# Patient Record
Sex: Female | Born: 1945 | Race: White | Hispanic: No | Smoking: Never smoker
Health system: Southern US, Community
[De-identification: ages and names within clinical notes are randomized; demographics above are authoritative.]

## PROBLEM LIST (undated history)

## (undated) DIAGNOSIS — D649 Anemia, unspecified: Secondary | ICD-10-CM

## (undated) DIAGNOSIS — H269 Unspecified cataract: Secondary | ICD-10-CM

## (undated) DIAGNOSIS — M797 Fibromyalgia: Secondary | ICD-10-CM

## (undated) DIAGNOSIS — M069 Rheumatoid arthritis, unspecified: Secondary | ICD-10-CM

## (undated) DIAGNOSIS — C50919 Malignant neoplasm of unspecified site of unspecified female breast: Secondary | ICD-10-CM

## (undated) HISTORY — DX: Rheumatoid arthritis, unspecified: M06.9

## (undated) HISTORY — DX: Anemia, unspecified: D64.9

## (undated) HISTORY — DX: Fibromyalgia: M79.7

## (undated) HISTORY — DX: Unspecified cataract: H26.9

## (undated) HISTORY — DX: Malignant neoplasm of unspecified site of unspecified female breast: C50.919

---

## 2012-01-26 ENCOUNTER — Ambulatory Visit: Payer: Self-pay | Admitting: Internal Medicine

## 2020-09-13 DIAGNOSIS — C50919 Malignant neoplasm of unspecified site of unspecified female breast: Secondary | ICD-10-CM

## 2020-09-13 HISTORY — DX: Malignant neoplasm of unspecified site of unspecified female breast: C50.919

## 2020-09-13 HISTORY — PX: BREAST BIOPSY: SHX20

## 2021-01-10 ENCOUNTER — Encounter: Payer: Self-pay | Admitting: Oncology

## 2021-01-10 ENCOUNTER — Inpatient Hospital Stay: Payer: Medicare Other | Attending: Oncology | Admitting: Oncology

## 2021-01-10 ENCOUNTER — Telehealth: Payer: Self-pay | Admitting: Oncology

## 2021-01-10 ENCOUNTER — Inpatient Hospital Stay: Payer: Medicare Other

## 2021-01-10 ENCOUNTER — Encounter (INDEPENDENT_AMBULATORY_CARE_PROVIDER_SITE_OTHER): Payer: Self-pay

## 2021-01-10 VITALS — BP 165/91 | HR 82 | Temp 97.8°F | Wt 204.2 lb

## 2021-01-10 DIAGNOSIS — Z800 Family history of malignant neoplasm of digestive organs: Secondary | ICD-10-CM | POA: Insufficient documentation

## 2021-01-10 DIAGNOSIS — Z170 Estrogen receptor positive status [ER+]: Secondary | ICD-10-CM | POA: Insufficient documentation

## 2021-01-10 DIAGNOSIS — Z87898 Personal history of other specified conditions: Secondary | ICD-10-CM

## 2021-01-10 DIAGNOSIS — Z8379 Family history of other diseases of the digestive system: Secondary | ICD-10-CM

## 2021-01-10 DIAGNOSIS — C50911 Malignant neoplasm of unspecified site of right female breast: Secondary | ICD-10-CM

## 2021-01-10 DIAGNOSIS — N6320 Unspecified lump in the left breast, unspecified quadrant: Secondary | ICD-10-CM

## 2021-01-10 DIAGNOSIS — R928 Other abnormal and inconclusive findings on diagnostic imaging of breast: Secondary | ICD-10-CM

## 2021-01-10 DIAGNOSIS — Z79899 Other long term (current) drug therapy: Secondary | ICD-10-CM | POA: Diagnosis not present

## 2021-01-10 DIAGNOSIS — R109 Unspecified abdominal pain: Secondary | ICD-10-CM | POA: Insufficient documentation

## 2021-01-10 DIAGNOSIS — Z801 Family history of malignant neoplasm of trachea, bronchus and lung: Secondary | ICD-10-CM | POA: Diagnosis not present

## 2021-01-10 DIAGNOSIS — Z833 Family history of diabetes mellitus: Secondary | ICD-10-CM | POA: Insufficient documentation

## 2021-01-10 NOTE — Telephone Encounter (Signed)
Call placed to schedule appointments no answer after 3 attempts, note added for verification of calls made.

## 2021-01-11 NOTE — Progress Notes (Signed)
Supported patient and her daughter at initial Med/Onc visit with Dr. Janese Banks.   Her breast cancer was diagnosed at [facility] in September 2021, but patient lives in [place], and wants to come heare for breast cancer care.   Dr. Janese Banks thoroughly explained treatment options.  Treatment plan will be determined after mammogram, and ultrasound per patient request.

## 2021-01-13 ENCOUNTER — Inpatient Hospital Stay
Admission: RE | Admit: 2021-01-13 | Discharge: 2021-01-13 | Disposition: A | Discharge status: Home / Self Care | Payer: Self-pay | Source: Ambulatory Visit | Attending: *Deleted | Admitting: *Deleted

## 2021-01-13 ENCOUNTER — Other Ambulatory Visit: Payer: Self-pay | Admitting: *Deleted

## 2021-01-13 DIAGNOSIS — Z1231 Encounter for screening mammogram for malignant neoplasm of breast: Secondary | ICD-10-CM

## 2021-01-17 ENCOUNTER — Encounter: Payer: Self-pay | Admitting: Oncology

## 2021-01-17 NOTE — Progress Notes (Signed)
Hematology/Oncology Consult note [facility] [phone] [phone]  Patient Care Team: Gladstone Lighter, MD as PCP - General (Internal Medicine)   Name of the patient: Sylvia Rivera  [id number]  1946-09-28    Reason for referral-new diagnosis of breast cancer   Referring physician-Dr. Tressia Miners  Date of visit: 01/17/21   History of presenting illness- Patient is a 75 year old female who was found to have a right breast mass on the CT scan done for abdominal pain.  This was followed by a bilateral mammogram on 09/06/2020 which showed a 1.9 cm spiculated mass in the right breast and 2.4 cm mass in the left breast the right breast mass was a BI-RADS Category 5 and the left breast mass was a BI-RADS Category 4 both these breast masses were biopsied.  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  Tumor was ER 100% positive PR 5% positive and HER-2 equivocal by IHC but negative by FISH.  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  Patient was seen by surgical oncology at [facility] but then decided to move her care to [facility] and has not sought any treatment for her cancer.  She is presently unsure if she would like to pursue any treatment for her breast cancer.  Patient is independent of her ADLs and denies any significant complaints at this time  ECOG PS- 1  Pain scale- 0   Review of systems- Review of Systems  Constitutional: Negative for chills, fever, malaise/fatigue and weight loss.  HENT: Negative for congestion, ear discharge and nosebleeds.   Eyes: Negative for blurred vision.  Respiratory: Negative for cough, hemoptysis, sputum production, shortness of breath and wheezing.   Cardiovascular: Negative for chest pain, palpitations, orthopnea and claudication.  Gastrointestinal: Negative for abdominal pain,  blood in stool, constipation, diarrhea, heartburn, melena, nausea and vomiting.  Genitourinary: Negative for dysuria, flank pain, frequency, hematuria and urgency.  Musculoskeletal: Negative for back pain, joint pain and myalgias.  Skin: Negative for rash.  Neurological: Negative for dizziness, tingling, focal weakness, seizures, weakness and headaches.  Endo/Heme/Allergies: Does not bruise/bleed easily.  Psychiatric/Behavioral: Negative for depression and suicidal ideas. The patient does not have insomnia.     Not on File  There are no problems to display for this patient.    Past Medical History:  Diagnosis Date  . Anemia   . Cataract   . Fibromyalgia   . Rheumatoid arthritis ([place])      History reviewed. No pertinent surgical history.  Social History   Socioeconomic History  . Marital status: Widowed    Spouse name: Not on file  . Number of children: Not on file  . Years of education: Not on file  . Highest education level: Not on file  Occupational History  . Not on file  Tobacco Use  . Smoking status: Never Smoker  . Smokeless tobacco: Never Used  Vaping Use  . Vaping Use: Never used  Substance and Sexual Activity  . Alcohol use: Never  . Drug use: Never  . Sexual activity: Not Currently  Other Topics Concern  . Not on file  Social History Narrative  . Not on file   Social Determinants of Health   Financial Resource Strain: Not on file  Food Insecurity: Not on file  Transportation Needs: Not on file  Physical Activity: Not on file  Stress: Not on file  Social Connections:  Not on file  Intimate Partner Violence: Not on file     Family History  Problem Relation Age of Onset  . Esophageal cancer Sister   . Lung cancer Sister   . Cirrhosis Brother   . Diabetes Maternal Grandmother   . Lung cancer Niece      Current Outpatient Medications:  .  acetaminophen (TYLENOL) 500 MG tablet, Take 500 mg by mouth daily., Disp: , Rfl:  .  aspirin 81 MG  chewable tablet, Chew by mouth., Disp: , Rfl:  .  ferrous sulfate 325 (65 FE) MG tablet, daily., Disp: , Rfl:  .  vitamin B-12 (CYANOCOBALAMIN) 1000 MCG tablet, Take 1,000 mcg by mouth daily., Disp: , Rfl:  .  VITAMIN D PO, Take 1,000 mcg by mouth daily., Disp: , Rfl:    Physical exam:  Vitals:   01/10/21 1342 01/10/21 1400  BP: (!) 174/85 (!) 165/91  Pulse: 81 82  Temp: 97.8 F (36.6 C)   TempSrc: Tympanic   Weight: 204 lb 3.2 oz (92.6 kg)    Physical Exam Constitutional:      General: She is not in acute distress. Eyes:     Extraocular Movements: EOM normal.  Cardiovascular:     Rate and Rhythm: Normal rate and regular rhythm.     Heart sounds: Normal heart sounds.  Pulmonary:     Effort: Pulmonary effort is normal.     Breath sounds: Normal breath sounds.  Abdominal:     General: Bowel sounds are normal.     Palpations: Abdomen is soft.  Skin:    General: Skin is warm and dry.  Neurological:     Mental Status: She is alert and oriented to person, place, and time.    Breast exam: 1.5 cm retroareolar mass palpable in the right breast and 2.5 cm cystic mass palpable in the left breast in the retroareolar region.  No evidence of skin changes nipple inversion.  No palpable bilateral axillary adenopathy    No flowsheet data found. No flowsheet data found.  No images are attached to the encounter.  MM Outside Films Mammo  Result Date: 01/14/2021 This examination belongs to an outside facility and is stored here for comparison purposes only.  Contact the originating outside institution for any associated report or interpretation.  MM Outside Films Mammo  Result Date: 01/14/2021 This examination belongs to an outside facility and is stored here for comparison purposes only.  Contact the originating outside institution for any associated report or interpretation.  MM Outside Films Mammo  Result Date: 01/14/2021 This examination belongs to an outside facility and is stored  here for comparison purposes only.  Contact the originating outside institution for any associated report or interpretation.   Assessment and plan- Patient is a 75 y.o. female with newly diagnosed right breast invasive mammary carcinoma here to discuss further management  Patient had a bilateral diagnostic mammogram in September 2021 at [facility] which showed bilateral breast masses.  Right breast mass was 1.5 cm and reported as BI-RADS 5.  This was biopsy-proven invasive mammary carcinoma that was ER/PR positive and HER-2 negative by FISH grade 3.  The left breast mass showed focal usual ductal hyperplasia but no evidence of atypia or malignancy.  Patient has not pursued any treatment for this since then.  At this time would like to get a repeat bilateral diagnostic mammogram to determine how the mass has grown over the last 4 months.  I would also like to touch base with  radiology if they feel that the left breast biopsy results are concordant with radiological appearance.  Patient does not wish to be seen by surgery at this time.  She would like to discuss the results of her mammogram with me and then decide how to proceed  I explained to the patient that she has ER/PR positive breast cancer which would warrant hormone therapy for at least 5 years.  Standard of care treatment for invasive carcinoma would be surgery which can be either lumpectomy with radiation versus mastectomy.  She did have high-grade histology in her right breast biopsy but based on my conversation today I do not think the patient would be interested in Oncotype testing post surgery to determine if she would benefit from chemotherapy.  I will discuss everything with her in greater detail after her mammogram results are back   Thank you for this kind referral and the opportunity to participate in the care of this patient   Visit Diagnosis 1. Malignant neoplasm of right female breast, unspecified estrogen receptor status, unspecified  site of breast ([place])   2. Abnormal mammogram of left breast     Dr. Randa Evens, MD, MPH [facility] at [facility] [phone] 01/17/2021

## 2021-01-20 ENCOUNTER — Other Ambulatory Visit

## 2021-01-20 ENCOUNTER — Encounter

## 2021-01-24 ENCOUNTER — Ambulatory Visit
Admission: RE | Admit: 2021-01-24 | Discharge: 2021-01-24 | Disposition: A | Discharge status: Home / Self Care | Payer: Medicare Other | Source: Ambulatory Visit | Attending: Oncology | Admitting: Oncology

## 2021-01-24 ENCOUNTER — Telehealth: Payer: Self-pay | Admitting: Oncology

## 2021-01-24 ENCOUNTER — Other Ambulatory Visit: Payer: Self-pay

## 2021-01-24 DIAGNOSIS — C50911 Malignant neoplasm of unspecified site of right female breast: Secondary | ICD-10-CM | POA: Insufficient documentation

## 2021-01-24 DIAGNOSIS — R928 Other abnormal and inconclusive findings on diagnostic imaging of breast: Secondary | ICD-10-CM

## 2021-01-24 NOTE — Telephone Encounter (Signed)
Called daughter (per pt request) to schedule f/u with Dr. Janese Banks next week. Daughter is only able to transport pt on wed/thur. Appt confirmed on 01/30/21.

## 2021-01-28 ENCOUNTER — Telehealth: Payer: Self-pay | Admitting: Oncology

## 2021-01-28 NOTE — Telephone Encounter (Signed)
Pt called to cancel her 2/17 follow-up appt. Will have her daughter contact the office to reschedule. Appt canceled.

## 2021-01-30 ENCOUNTER — Ambulatory Visit: Payer: Medicare Other | Admitting: Oncology

## 2021-01-30 ENCOUNTER — Telehealth: Payer: Self-pay | Admitting: Oncology

## 2021-01-30 NOTE — Telephone Encounter (Signed)
Called pt's daughter, Levada Dy, to r/s appt. She was on her way into a conference and stated she would call back.

## 2021-02-25 ENCOUNTER — Encounter: Payer: Self-pay | Admitting: Oncology

## 2021-03-07 ENCOUNTER — Encounter: Payer: Self-pay | Admitting: Oncology

## 2021-04-04 ENCOUNTER — Inpatient Hospital Stay: Payer: Medicare Other | Attending: Oncology | Admitting: Oncology

## 2021-04-04 ENCOUNTER — Encounter: Payer: Self-pay | Admitting: Oncology

## 2021-04-04 DIAGNOSIS — Z1231 Encounter for screening mammogram for malignant neoplasm of breast: Secondary | ICD-10-CM | POA: Diagnosis not present

## 2021-04-04 DIAGNOSIS — C50211 Malignant neoplasm of upper-inner quadrant of right female breast: Secondary | ICD-10-CM | POA: Diagnosis present

## 2021-04-04 DIAGNOSIS — Z8379 Family history of other diseases of the digestive system: Secondary | ICD-10-CM | POA: Diagnosis not present

## 2021-04-04 DIAGNOSIS — Z801 Family history of malignant neoplasm of trachea, bronchus and lung: Secondary | ICD-10-CM | POA: Insufficient documentation

## 2021-04-04 DIAGNOSIS — R928 Other abnormal and inconclusive findings on diagnostic imaging of breast: Secondary | ICD-10-CM | POA: Diagnosis not present

## 2021-04-04 DIAGNOSIS — Z170 Estrogen receptor positive status [ER+]: Secondary | ICD-10-CM | POA: Insufficient documentation

## 2021-04-04 DIAGNOSIS — Z800 Family history of malignant neoplasm of digestive organs: Secondary | ICD-10-CM | POA: Insufficient documentation

## 2021-04-04 DIAGNOSIS — Z833 Family history of diabetes mellitus: Secondary | ICD-10-CM | POA: Diagnosis not present

## 2021-04-04 DIAGNOSIS — C50911 Malignant neoplasm of unspecified site of right female breast: Secondary | ICD-10-CM

## 2021-04-06 DIAGNOSIS — Z170 Estrogen receptor positive status [ER+]: Secondary | ICD-10-CM | POA: Insufficient documentation

## 2021-04-06 DIAGNOSIS — C50211 Malignant neoplasm of upper-inner quadrant of right female breast: Secondary | ICD-10-CM | POA: Insufficient documentation

## 2021-04-06 NOTE — Progress Notes (Signed)
Hematology/Oncology Consult note [facility]  [phone] [phone]  Patient Care Team: Gladstone Lighter, MD as PCP - General (Internal Medicine)   Name of the patient: Sylvia Rivera  [id number]  01-Dec-1946   Date of visit: 04/06/21  Diagnosis-stage I right breast cancer  Chief complaint/ Reason for visit-discuss further management of breast cancer  Heme/Onc history: Patient is a 75 year old female who was found to have a right breast mass on the CT scan done for abdominal pain.  This was followed by a bilateral mammogram on 09/06/2020 which showed a 1.9 cm spiculated mass in the right breast and 2.4 cm mass in the left breast the right breast mass was a BI-RADS Category 5 and the left breast mass was a BI-RADS Category 4 both these breast masses were biopsied.  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  Tumor was ER 100% positive PR 5% positive and HER-2 equivocal by IHC but negative by FISH.  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  Patient was seen by surgical oncology at [facility] but then decided to move her care to [facility] and has not sought any treatment for her cancer.  She is presently unsure if she would like to pursue any treatment for her breast cancer.    Repeat right breast mammogram in February 2022 which showed similar size of the right breast mass which measured about 1.5 x 1.4 x 1.8 cm similar to prior.  No evidence of right axillary adenopathy.  Stable size of the left breast mass 2.3 x 1.3 x 2.2 cm which was biopsy-proven to be benign   Interval history-patient had an earlier appointment following her mammogram but she is not sure why she missed it.  She is still unsure about proceeding with surgery.  ECOG PS- 1 Pain scale- 0   Review of systems- Review of Systems  Constitutional:  Negative for chills, fever, malaise/fatigue and weight loss.  HENT: Negative for congestion, ear discharge and nosebleeds.   Eyes: Negative for blurred vision.  Respiratory: Negative for cough, hemoptysis, sputum production, shortness of breath and wheezing.   Cardiovascular: Negative for chest pain, palpitations, orthopnea and claudication.  Gastrointestinal: Negative for abdominal pain, blood in stool, constipation, diarrhea, heartburn, melena, nausea and vomiting.  Genitourinary: Negative for dysuria, flank pain, frequency, hematuria and urgency.  Musculoskeletal: Negative for back pain, joint pain and myalgias.  Skin: Negative for rash.  Neurological: Negative for dizziness, tingling, focal weakness, seizures, weakness and headaches.  Endo/Heme/Allergies: Does not bruise/bleed easily.  Psychiatric/Behavioral: Negative for depression and suicidal ideas. The patient does not have insomnia.        No Known Allergies   Past Medical History:  Diagnosis Date  . Anemia   . Breast cancer ([place])   . Cataract   . Fibromyalgia   . Rheumatoid arthritis [facility])      Past Surgical History:  Procedure Laterality Date  . BREAST BIOPSY      Social History   Socioeconomic History  . Marital status: Widowed    Spouse name: Not on file  . Number of children: Not on file  . Years of education: Not on file  . Highest education level: Not on file  Occupational History  . Not on file  Tobacco Use  . Smoking status: Never Smoker  . Smokeless tobacco: Never Used  Vaping Use  . Vaping Use:  Never used  Substance and Sexual Activity  . Alcohol use: Never  . Drug use: Never  . Sexual activity: Not Currently  Other Topics Concern  . Not on file  Social History Narrative  . Not on file   Social Determinants of Health   Financial Resource Strain: Not on file  Food Insecurity: Not on file  Transportation Needs: Not on file  Physical Activity: Not on file  Stress: Not on file  Social  Connections: Not on file  Intimate Partner Violence: Not on file    Family History  Problem Relation Age of Onset  . Lung cancer Sister   . Cirrhosis Brother   . Diabetes Maternal Grandmother   . Lung cancer Niece   . Esophageal cancer Sister      Current Outpatient Medications:  .  acetaminophen (TYLENOL) 500 MG tablet, Take 1,000 mg by mouth daily., Disp: , Rfl:  .  aspirin EC 81 MG tablet, Take 81 mg by mouth daily. Swallow whole., Disp: , Rfl:  .  ferrous sulfate 325 (65 FE) MG tablet, daily., Disp: , Rfl:  .  vitamin B-12 (CYANOCOBALAMIN) 1000 MCG tablet, Take 1,000 mcg by mouth daily., Disp: , Rfl:  .  VITAMIN D PO, Take 1,000 mcg by mouth daily., Disp: , Rfl:   Physical exam: There were no vitals filed for this visit. Physical Exam Constitutional:      General: She is not in acute distress. Cardiovascular:     Rate and Rhythm: Normal rate and regular rhythm.     Heart sounds: Normal heart sounds.  Pulmonary:     Effort: Pulmonary effort is normal.     Breath sounds: Normal breath sounds.  Skin:    General: Skin is warm and dry.  Neurological:     Mental Status: She is alert and oriented to person, place, and time.      Assessment and plan- Patient is a 75 y.o. female with history of right breast invasive mammary carcinoma stage Ia cT1 cN0 M0 ER/PR positive HER2 negative here to discuss further management  Discussed with the patient that is compared to September 2021 her repeat mammogram in February 2022 did not show any significant increase in the size of the right breast mass which was biopsy-proven invasive mammary carcinoma.  I again strongly recommend getting a surgical opinion and considering a lumpectomy and sentinel lymph for biopsy.  She is still unsure whether she wants to proceed with surgery.  Also her recommended that the less desirable option should she decide not to proceed with surgery would be proceed with endocrine therapy with tamoxifen or Arimidex.   Patient is concerned about the side effects of those medications as well and does not wish to start any medication as of now.  She wants to continue monitoring her breast mass with a repeat mammogram.  I explained to her that we could do that but it is still not going to go away without any intervention and may potentially spread to involve the lymph nodes.  Patient understands but would like to hold off on any treatment at this time.  I will tentatively get a repeat mammogram in August 2022 and see her thereafter.  She knows that she can call us anytime if she changes her mind about proceeding with surgery or endocrine therapy  Cancer Staging Malignant neoplasm of upper-inner quadrant of right breast in female, estrogen receptor positive (HCC) Staging form: Breast, AJCC 8th Edition - Clinical stage from 03/03/2021: Stage IA (  cT1c, cN0, cM0, G3, ER+, PR+, HER2-) - Signed by Sindy Guadeloupe, MD on 04/06/2021 Histologic grading system: 3 grade system     Visit Diagnosis 1. Malignant neoplasm of right female breast, unspecified estrogen receptor status, unspecified site of breast ([place])   2. Abnormal mammogram of left breast   3. Encounter for screening mammogram for malignant neoplasm of breast       Dr. Randa Evens, MD, MPH [facility] at [facility] [phone] 04/06/2021 6:59 PM

## 2021-04-07 ENCOUNTER — Other Ambulatory Visit: Payer: Self-pay

## 2021-04-07 DIAGNOSIS — Z1231 Encounter for screening mammogram for malignant neoplasm of breast: Secondary | ICD-10-CM

## 2021-04-07 DIAGNOSIS — R928 Other abnormal and inconclusive findings on diagnostic imaging of breast: Secondary | ICD-10-CM

## 2021-07-24 ENCOUNTER — Other Ambulatory Visit: Payer: Self-pay | Admitting: Oncology

## 2021-07-24 DIAGNOSIS — R928 Other abnormal and inconclusive findings on diagnostic imaging of breast: Secondary | ICD-10-CM

## 2021-07-25 ENCOUNTER — Ambulatory Visit
Admission: RE | Admit: 2021-07-25 | Discharge: 2021-07-25 | Disposition: A | Discharge status: Home / Self Care | Payer: Medicare Other | Source: Ambulatory Visit | Attending: Oncology | Admitting: Oncology

## 2021-07-25 ENCOUNTER — Other Ambulatory Visit: Payer: Self-pay

## 2021-07-25 DIAGNOSIS — R928 Other abnormal and inconclusive findings on diagnostic imaging of breast: Secondary | ICD-10-CM

## 2021-07-25 DIAGNOSIS — C50211 Malignant neoplasm of upper-inner quadrant of right female breast: Secondary | ICD-10-CM

## 2021-07-25 DIAGNOSIS — Z170 Estrogen receptor positive status [ER+]: Secondary | ICD-10-CM | POA: Diagnosis present

## 2021-07-28 ENCOUNTER — Inpatient Hospital Stay: Payer: Medicare Other | Admitting: Oncology

## 2021-08-13 ENCOUNTER — Ambulatory Visit: Payer: Medicare Other | Admitting: Oncology

## 2021-09-10 ENCOUNTER — Encounter: Payer: Self-pay | Admitting: Oncology

## 2021-09-10 ENCOUNTER — Inpatient Hospital Stay: Payer: Medicare Other | Attending: Oncology | Admitting: Oncology

## 2021-09-10 ENCOUNTER — Other Ambulatory Visit: Payer: Self-pay

## 2021-09-10 VITALS — BP 147/94 | HR 80 | Temp 98.0°F | Resp 16 | Wt 187.0 lb

## 2021-09-10 DIAGNOSIS — Z170 Estrogen receptor positive status [ER+]: Secondary | ICD-10-CM | POA: Diagnosis not present

## 2021-09-10 DIAGNOSIS — Z8379 Family history of other diseases of the digestive system: Secondary | ICD-10-CM | POA: Insufficient documentation

## 2021-09-10 DIAGNOSIS — Z801 Family history of malignant neoplasm of trachea, bronchus and lung: Secondary | ICD-10-CM | POA: Insufficient documentation

## 2021-09-10 DIAGNOSIS — R928 Other abnormal and inconclusive findings on diagnostic imaging of breast: Secondary | ICD-10-CM | POA: Diagnosis not present

## 2021-09-10 DIAGNOSIS — Z833 Family history of diabetes mellitus: Secondary | ICD-10-CM | POA: Diagnosis not present

## 2021-09-10 DIAGNOSIS — Z79899 Other long term (current) drug therapy: Secondary | ICD-10-CM | POA: Insufficient documentation

## 2021-09-10 DIAGNOSIS — R5383 Other fatigue: Secondary | ICD-10-CM | POA: Diagnosis not present

## 2021-09-10 DIAGNOSIS — C50211 Malignant neoplasm of upper-inner quadrant of right female breast: Secondary | ICD-10-CM

## 2021-09-10 DIAGNOSIS — Z800 Family history of malignant neoplasm of digestive organs: Secondary | ICD-10-CM | POA: Diagnosis not present

## 2021-09-10 NOTE — Progress Notes (Signed)
She is still tired and she is use to that. No better no better no worse. She is thinking that she wants to stop b12 pills. She does not think it works for her.

## 2021-09-10 NOTE — Progress Notes (Signed)
Hematology/Oncology Consult note [facility]  [phone] [phone]  Patient Care Team: Gladstone Lighter, MD as PCP - General (Internal Medicine)   Name of the patient: Sylvia Rivera  [id number]  03/05/1946   Date of visit: 09/10/21  Diagnosis-stage I right breast cancer  Chief complaint/ Reason for visit-discuss mammogram results and further management  Heme/Onc history: Patient is a 75 year old female who was found to have a right breast mass on the CT scan done for abdominal pain.  This was followed by a bilateral mammogram on 09/06/2020 which showed a 1.9 cm spiculated mass in the right breast and 2.4 cm mass in the left breast the right breast mass was a BI-RADS Category 5 and the left breast mass was a BI-RADS Category 4 both these breast masses were biopsied.  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  Tumor was ER 100% positive PR 5% positive and HER-2 equivocal by IHC but negative by FISH.  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  Patient was seen by surgical oncology at [facility] but then decided to move her care to [facility] and has not sought any treatment for her cancer.  She is presently unsure if she would like to pursue any treatment for her breast cancer.     Repeat right breast mammogram in February 2022 which showed similar size of the right breast mass which measured about 1.5 x 1.4 x 1.8 cm similar to prior.  No evidence of right axillary adenopathy.  Stable size of the left breast mass 2.3 x 1.3 x 2.2 cm which was biopsy-proven to be benign  Interval history-patient is doing well for range presently.  Denies any hospitalizations or illnesses.  Appetite and weight have remained stable.  ECOG PS- 2 Pain scale- 3   Review of systems- Review of Systems  Constitutional:  Positive for  malaise/fatigue. Negative for chills, fever and weight loss.  HENT:  Negative for congestion, ear discharge and nosebleeds.   Eyes:  Negative for blurred vision.  Respiratory:  Negative for cough, hemoptysis, sputum production, shortness of breath and wheezing.   Cardiovascular:  Negative for chest pain, palpitations, orthopnea and claudication.  Gastrointestinal:  Negative for abdominal pain, blood in stool, constipation, diarrhea, heartburn, melena, nausea and vomiting.  Genitourinary:  Negative for dysuria, flank pain, frequency, hematuria and urgency.  Musculoskeletal:  Negative for back pain, joint pain and myalgias.  Skin:  Negative for rash.  Neurological:  Negative for dizziness, tingling, focal weakness, seizures, weakness and headaches.  Endo/Heme/Allergies:  Does not bruise/bleed easily.  Psychiatric/Behavioral:  Negative for depression and suicidal ideas. The patient does not have insomnia.       No Known Allergies   Past Medical History:  Diagnosis Date   Anemia    Breast cancer ([place]) 09/2020   right breast   Cataract    Fibromyalgia    Rheumatoid arthritis [facility])      Past Surgical History:  Procedure Laterality Date   BREAST BIOPSY Bilateral 09/2020   right- invasive mammary carcinoma, lt.- usual ductal hyperplasia    Social History   Socioeconomic History   Marital status: Widowed    Spouse name: Not on file   Number of children: Not on file   Years of education: Not on file   Highest education level: Not on file  Occupational History   Not on file  Tobacco Use   Smoking status: Never   Smokeless tobacco: Never  Vaping Use   Vaping Use: Never used  Substance and Sexual Activity   Alcohol use: Never   Drug use: Never   Sexual activity: Not Currently  Other Topics Concern   Not on file  Social History Narrative   Not on file   Social Determinants of Health   Financial Resource Strain: Not on file  Food Insecurity: Not on file  Transportation  Needs: Not on file  Physical Activity: Not on file  Stress: Not on file  Social Connections: Not on file  Intimate Partner Violence: Not on file    Family History  Problem Relation Age of Onset   Lung cancer Sister    Cirrhosis Brother    Diabetes Maternal Grandmother    Lung cancer Niece    Esophageal cancer Sister      Current Outpatient Medications:    acetaminophen (TYLENOL) 650 MG CR tablet, Take 650 mg by mouth daily., Disp: , Rfl:    aspirin EC 81 MG tablet, Take 81 mg by mouth daily. Swallow whole., Disp: , Rfl:    ferrous sulfate 325 (65 FE) MG tablet, Take 325 mg by mouth daily with breakfast., Disp: , Rfl:    vitamin B-12 (CYANOCOBALAMIN) 1000 MCG tablet, Take 1,000 mcg by mouth daily., Disp: , Rfl:    VITAMIN D PO, Take 1,000 mcg by mouth daily., Disp: , Rfl:   Physical exam:  Vitals:   09/10/21 0944  BP: (!) 147/94  Pulse: 80  Resp: 16  Temp: 98 F (36.7 C)  TempSrc: Oral  SpO2: 100%  Weight: 187 lb (84.8 kg)   Physical Exam Constitutional:      Comments: Ambulates slowly with a Deangelo.  Appears in no acute distress  Cardiovascular:     Rate and Rhythm: Normal rate and regular rhythm.     Heart sounds: Normal heart sounds.  Pulmonary:     Effort: Pulmonary effort is normal.     Breath sounds: Normal breath sounds.  Abdominal:     General: Bowel sounds are normal.     Palpations: Abdomen is soft.  Skin:    General: Skin is warm and dry.  Neurological:     Mental Status: She is alert and oriented to person, place, and time.  Breast exam: Palpable 1.5 cm right retroareolar mass.  No palpable right axillary adenopathy.  I could not palpate her left breast mass.  No palpable left axillary adenopathy.   Assessment and plan- Patient is a 75 y.o. female with history of biopsy-proven right breast cancer without any intervention so far here for routine follow-up  Patient was first diagnosed with stage I ER positive HER2 negative right breast cancer in  September 2021.  We have been doing mammograms subsequently which have shown stable size of the right breast mass.  Patient does not wish to undergo any intervention including surgery or endocrine therapy for this and would like to keep monitoring every 6 months.  If there is significant increase in the size of the breast mass or it starts causing her problems only then she will consider knee intervention in the future.  She also had a left breast mass which was biopsy-proven benign.  I will see her back in 6 months with a bilateral mammogram   Visit Diagnosis 1. Other abnormal and inconclusive findings on diagnostic imaging of breast   2. Malignant neoplasm of upper-inner quadrant of right breast in female,  estrogen receptor positive ([place])      Dr. Randa Evens, MD, MPH [facility] at [facility] [phone] 09/10/2021 11:49 AM

## 2022-01-22 ENCOUNTER — Other Ambulatory Visit: Payer: Self-pay | Admitting: Internal Medicine

## 2022-01-26 ENCOUNTER — Other Ambulatory Visit: Payer: Medicare Other

## 2022-01-26 ENCOUNTER — Telehealth: Payer: Self-pay | Admitting: Oncology

## 2022-01-26 NOTE — Telephone Encounter (Signed)
Pt daughter called to cancel her 01/27/22 follow-up for mammogram. Did not want to reschedule at this time and pt didn't have mammogram completed on 2/13.

## 2022-01-28 ENCOUNTER — Inpatient Hospital Stay: Payer: Medicare Other | Admitting: Oncology

## 2022-01-29 ENCOUNTER — Telehealth: Payer: Self-pay

## 2022-01-29 NOTE — Telephone Encounter (Signed)
I was calling patient to see if she wants to reschedule her mammogram appointment. Patient states she has to get with her daughter because her daughter hours had changed and her daughter will contact us once she has all that straighten out.

## 2022-07-30 ENCOUNTER — Other Ambulatory Visit: Payer: Self-pay | Admitting: Internal Medicine

## 2022-07-30 DIAGNOSIS — C50911 Malignant neoplasm of unspecified site of right female breast: Secondary | ICD-10-CM

## 2022-07-30 DIAGNOSIS — N6310 Unspecified lump in the right breast, unspecified quadrant: Secondary | ICD-10-CM

## 2022-07-30 DIAGNOSIS — R928 Other abnormal and inconclusive findings on diagnostic imaging of breast: Secondary | ICD-10-CM

## 2022-09-03 ENCOUNTER — Ambulatory Visit
Admission: RE | Admit: 2022-09-03 | Discharge: 2022-09-03 | Disposition: A | Discharge status: Home / Self Care | Payer: Medicare Other | Source: Ambulatory Visit | Attending: Internal Medicine | Admitting: Internal Medicine

## 2022-09-03 DIAGNOSIS — C50911 Malignant neoplasm of unspecified site of right female breast: Secondary | ICD-10-CM

## 2022-09-03 DIAGNOSIS — R928 Other abnormal and inconclusive findings on diagnostic imaging of breast: Secondary | ICD-10-CM | POA: Insufficient documentation

## 2022-09-03 DIAGNOSIS — N6310 Unspecified lump in the right breast, unspecified quadrant: Secondary | ICD-10-CM

## 2023-02-02 IMAGING — MG MM DIGITAL DIAGNOSTIC UNILAT*R* W/ TOMO W/ CAD
4 series · 4 of 12 positions shown · non-contrast
Comparison: Previous exam(s).

CLINICAL DATA: 74-year-old female for follow-up of known invasive
RIGHT breast carcinoma diagnosed in Sunday September, 2020, for which she
has declined treatment.

EXAM:
DIGITAL DIAGNOSTIC UNILATERAL RIGHT MAMMOGRAM WITH TOMOSYNTHESIS AND
CAD; ULTRASOUND RIGHT BREAST LIMITED
TECHNIQUE: Right digital diagnostic mammography and breast tomosynthesis was
performed. The images were evaluated with computer-aided detection.;
Targeted ultrasound examination of the right breast was performed

[R MLO synth-2D]
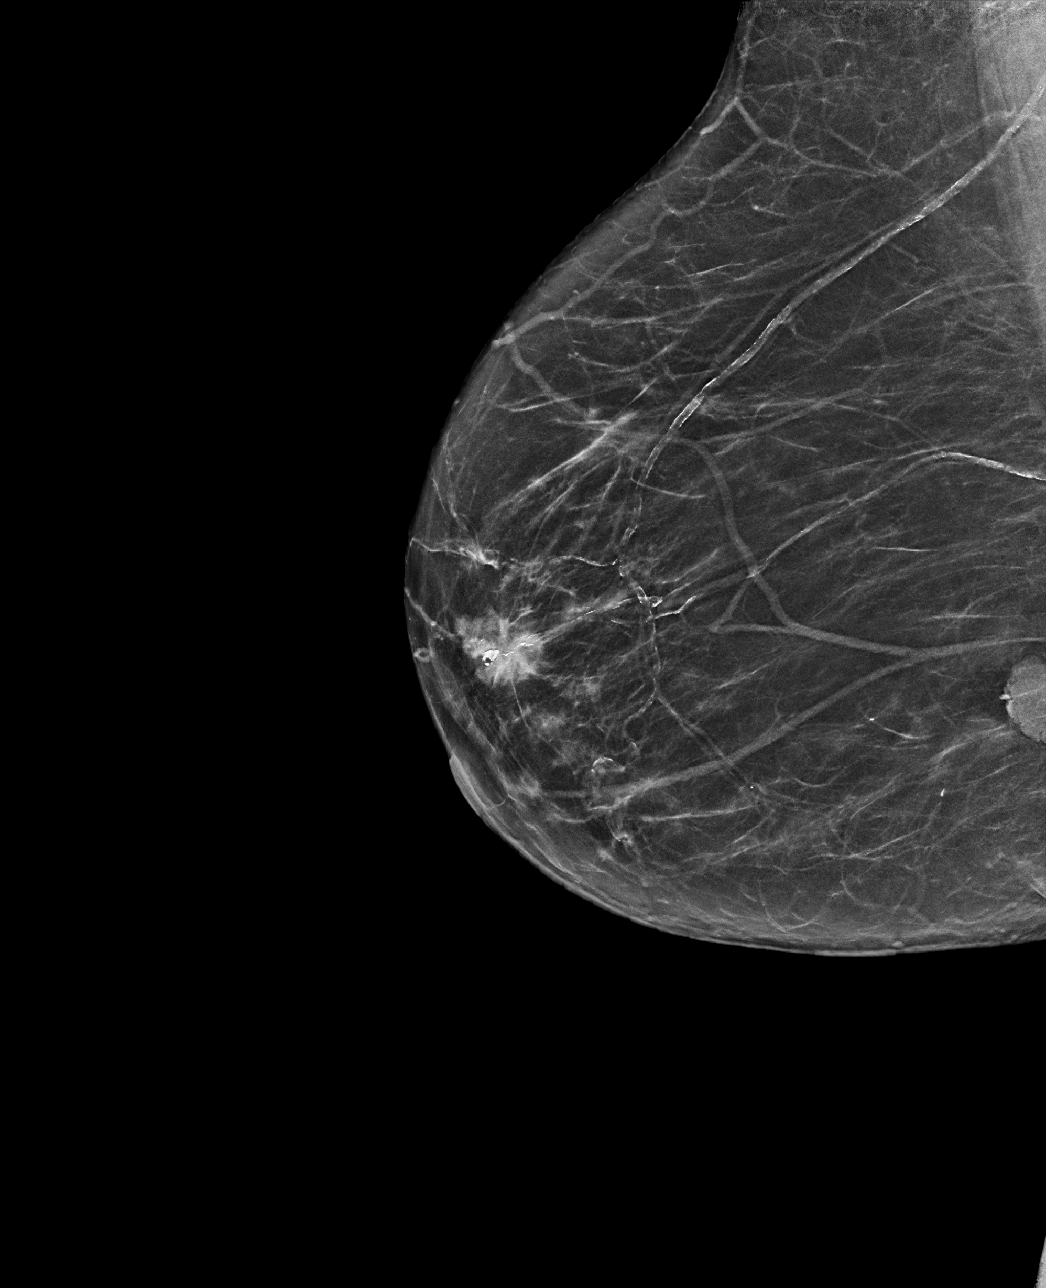

[R CC synth-2D]
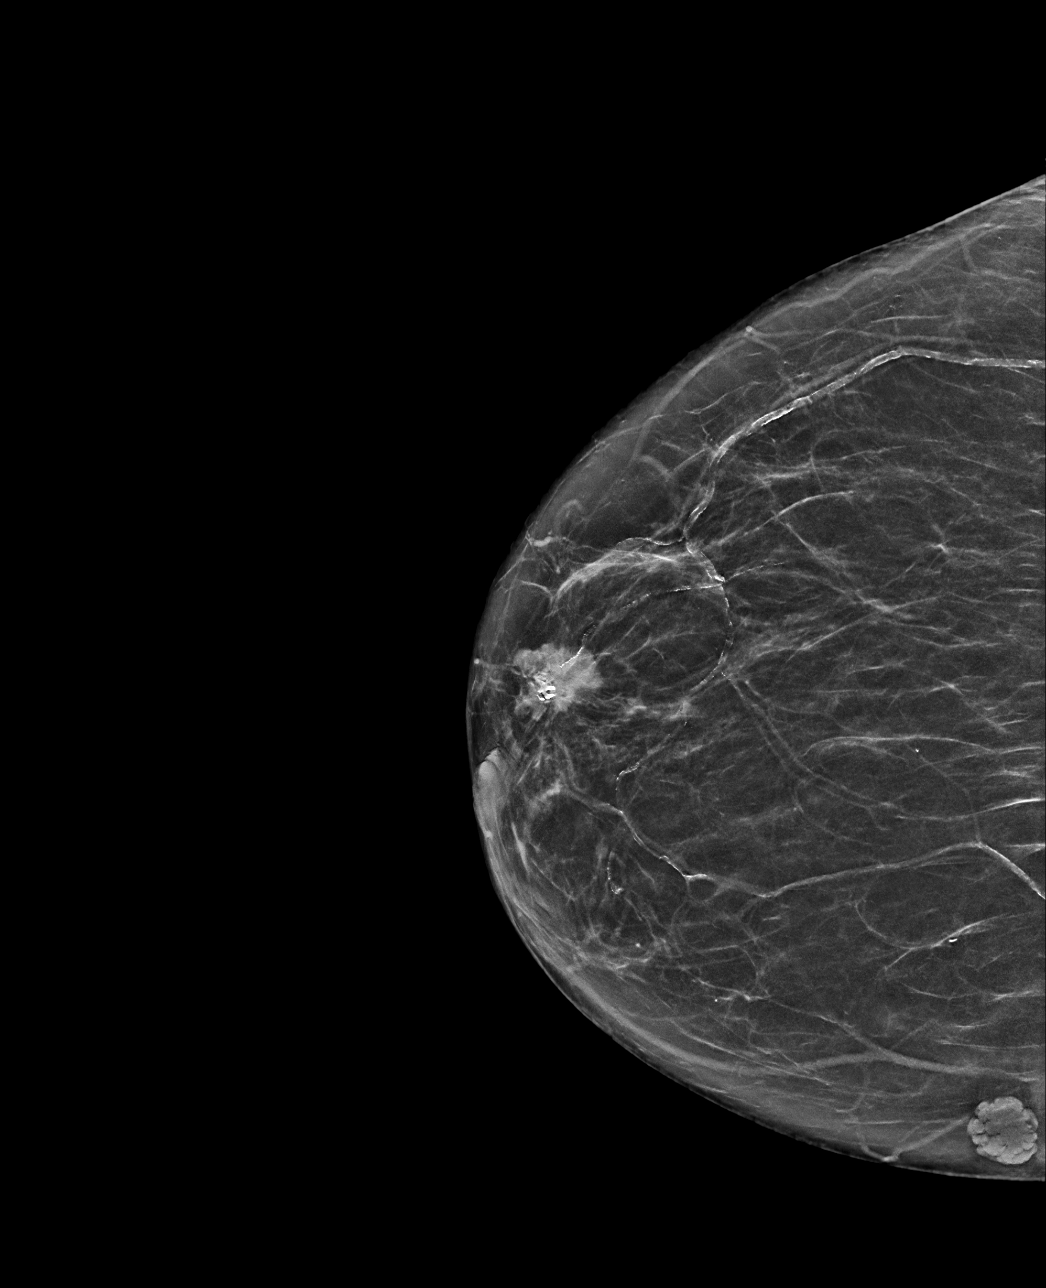

[R CC tomo · tomo slice 32/63.0]
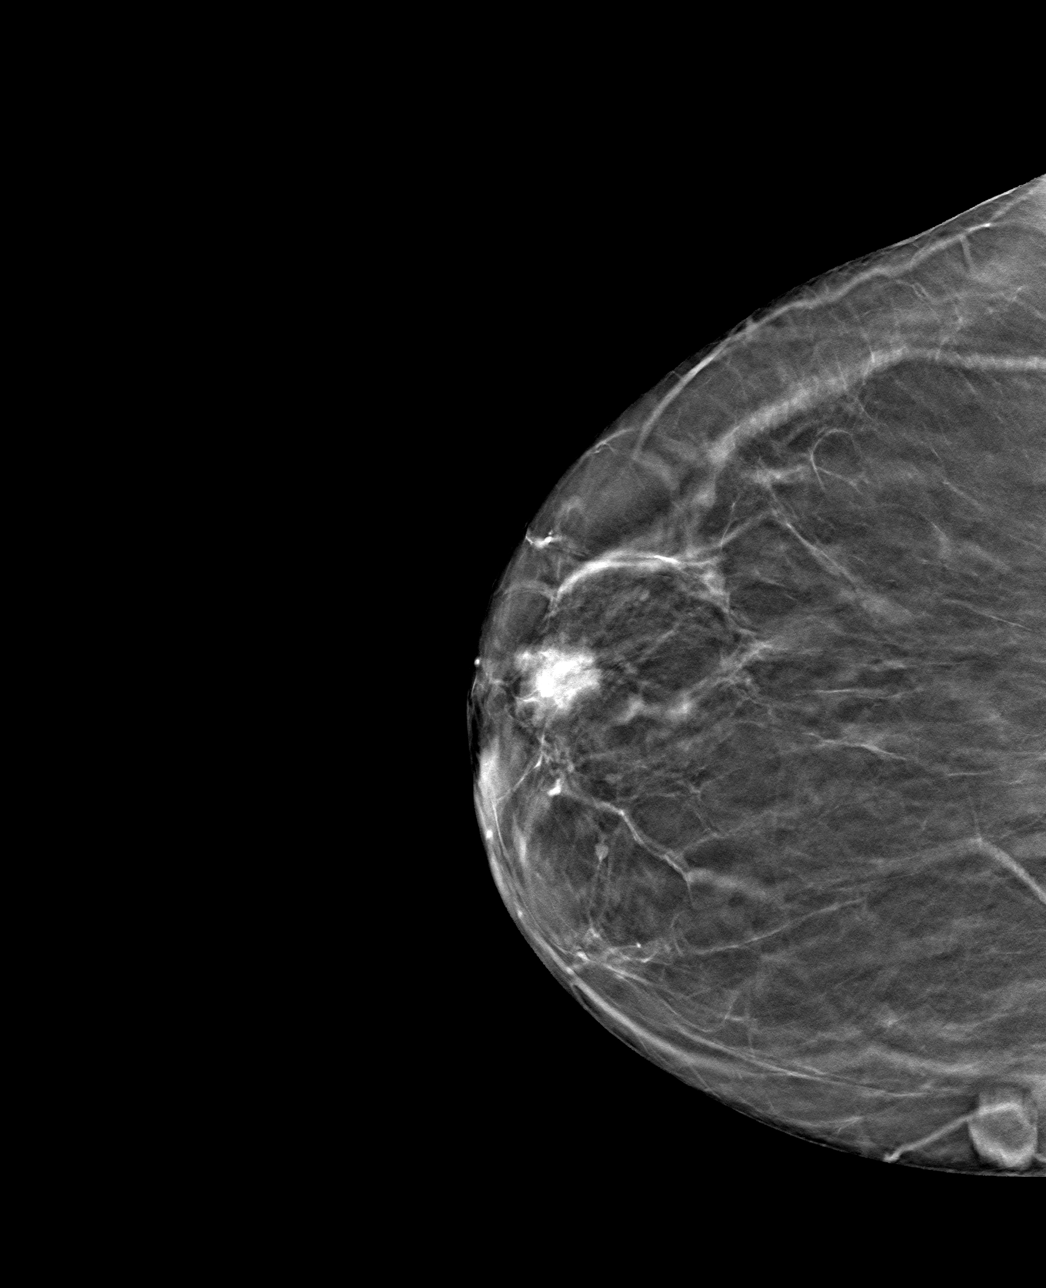

[R MLO tomo · tomo slice 35/68.0]
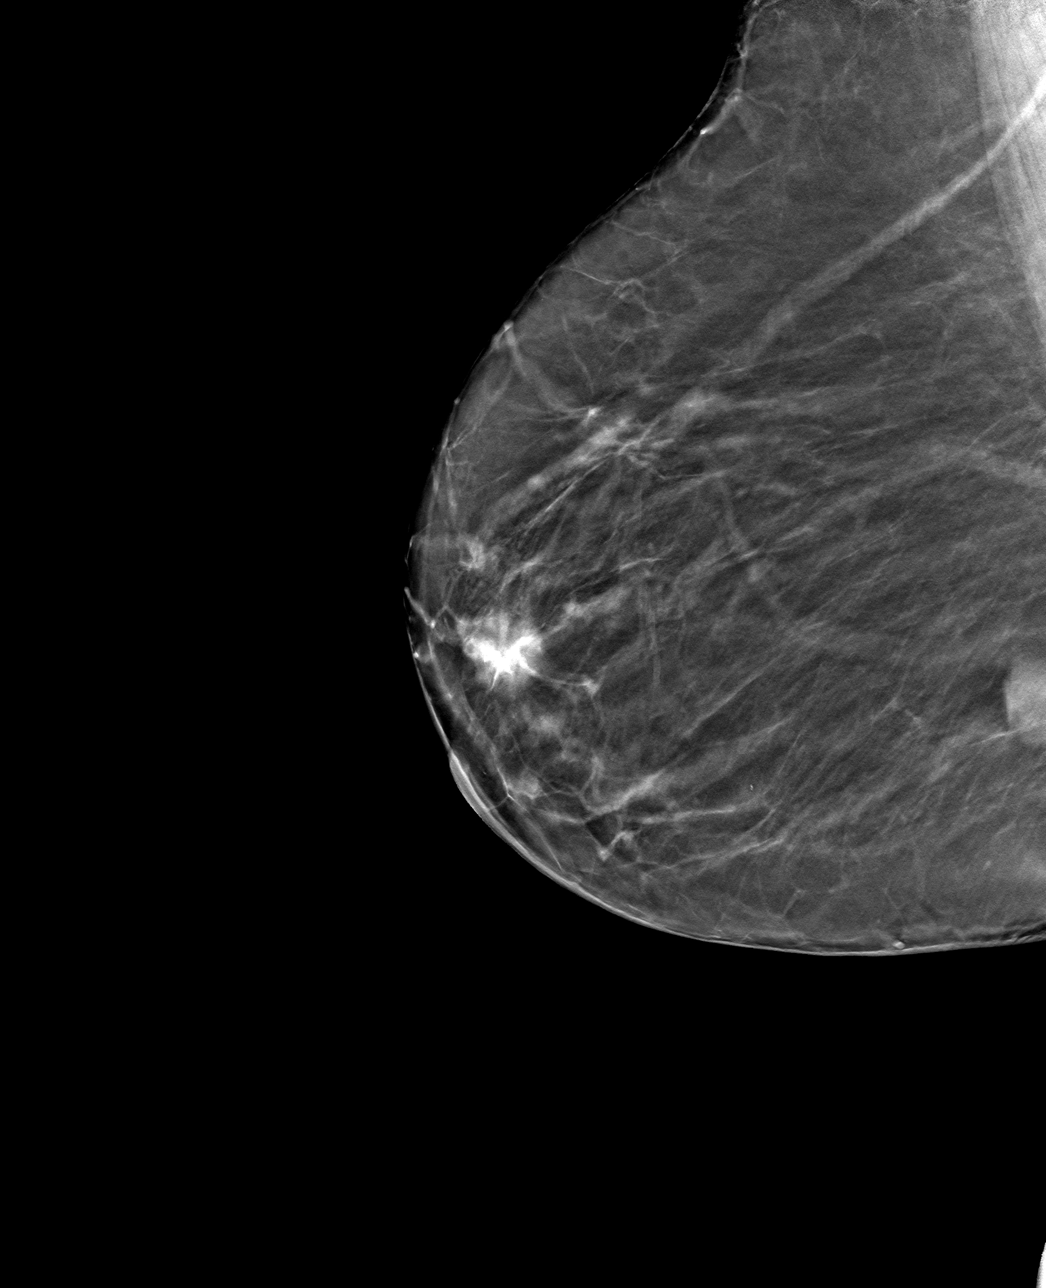

[4 of 12 positions shown; findings below may reference images not displayed]

ACR Breast Density Category b: There are scattered areas of
fibroglandular density.
FINDINGS: Full field views of the RIGHT breast demonstrate no significant
change in UPPER-OUTER RETROAREOLAR RIGHT breast mass/malignancy.

No new abnormalities are identified within the RIGHT breast.

Targeted ultrasound is performed, showing a 1.6 x 1.2 x 1.5 cm
irregular hypoechoic mass in the UPPER RETROAREOLAR RIGHT breast,
unchanged from 01/24/2021 and compatible with biopsy-proven
malignancy.

No abnormal RIGHT axillary lymph nodes are noted.
IMPRESSION: 1. Unchanged 1.6 cm biopsy-proven malignancy within the RETROAREOLAR
RIGHT breast.
2. No new RIGHT breast findings. No abnormal RIGHT axillary lymph
nodes.

RECOMMENDATION:
Treatment plan as indicated

I have discussed the findings and recommendations with the patient.
If applicable, a reminder letter will be sent to the patient
regarding the next appointment.

BI-RADS CATEGORY  6: Known biopsy-proven malignancy.

## 2023-06-02 ENCOUNTER — Encounter: Payer: Self-pay | Admitting: Internal Medicine

## 2023-06-03 ENCOUNTER — Other Ambulatory Visit: Payer: Self-pay | Admitting: Internal Medicine

## 2023-06-03 DIAGNOSIS — Z853 Personal history of malignant neoplasm of breast: Secondary | ICD-10-CM

## 2023-09-06 ENCOUNTER — Ambulatory Visit
Admission: RE | Admit: 2023-09-06 | Discharge: 2023-09-06 | Disposition: A | Discharge status: Home / Self Care | Payer: Medicare Other | Source: Ambulatory Visit | Attending: Internal Medicine | Admitting: Internal Medicine

## 2023-09-06 DIAGNOSIS — Z853 Personal history of malignant neoplasm of breast: Secondary | ICD-10-CM | POA: Diagnosis present

## 2023-09-08 ENCOUNTER — Encounter: Payer: Self-pay | Admitting: *Deleted

## 2023-09-08 NOTE — Progress Notes (Signed)
Received a call from [facility], Dr. Vedia Coffer nurse that they received a referral stating the patient would like to be treated for her known breast cancer which she has previously declined.   Sherri asked if I would reach out to the patient and see if she would like to be seen by Dr. Smith Robert again as well.   I attempted to call patient, it went to VM which was full.  I will try again tomorrow.

## 2023-09-09 ENCOUNTER — Encounter: Payer: Self-pay | Admitting: *Deleted

## 2023-09-09 NOTE — Progress Notes (Signed)
I spoke with Sylvia Rivera, she would like to wait until she speaks with Dr. Hazle Quant next week before she schedules with Dr. Smith Robert.

## 2023-09-17 ENCOUNTER — Encounter: Payer: Self-pay | Admitting: *Deleted

## 2023-09-17 NOTE — Progress Notes (Signed)
Ms. Sylvia Rivera has met with Dr. Maia Plan to discuss surgical options for her breast cancer and would now like to speak with Dr. Smith Robert again as well.   She wants to wait to see Radiation until further discussing with Dr. Smith Robert, Referral order from Dr. Nemiah Commander has been deferred at this time.   Appt. Details have been given to daughter Marylene Land per patient's request.

## 2023-09-20 ENCOUNTER — Inpatient Hospital Stay: Payer: Medicare Other | Attending: Oncology | Admitting: Oncology

## 2023-09-20 ENCOUNTER — Encounter: Payer: Self-pay | Admitting: Oncology

## 2023-09-20 VITALS — BP 157/78 | HR 80 | Temp 97.1°F | Resp 16 | Ht 67.0 in | Wt 200.0 lb

## 2023-09-20 DIAGNOSIS — Z7189 Other specified counseling: Secondary | ICD-10-CM | POA: Diagnosis not present

## 2023-09-20 DIAGNOSIS — N6325 Unspecified lump in the left breast, overlapping quadrants: Secondary | ICD-10-CM | POA: Diagnosis not present

## 2023-09-20 DIAGNOSIS — N6453 Retraction of nipple: Secondary | ICD-10-CM | POA: Insufficient documentation

## 2023-09-20 DIAGNOSIS — Z800 Family history of malignant neoplasm of digestive organs: Secondary | ICD-10-CM | POA: Diagnosis not present

## 2023-09-20 DIAGNOSIS — Z833 Family history of diabetes mellitus: Secondary | ICD-10-CM | POA: Diagnosis not present

## 2023-09-20 DIAGNOSIS — Z8379 Family history of other diseases of the digestive system: Secondary | ICD-10-CM | POA: Diagnosis not present

## 2023-09-20 DIAGNOSIS — Z170 Estrogen receptor positive status [ER+]: Secondary | ICD-10-CM | POA: Insufficient documentation

## 2023-09-20 DIAGNOSIS — Z79899 Other long term (current) drug therapy: Secondary | ICD-10-CM | POA: Diagnosis not present

## 2023-09-20 DIAGNOSIS — C50211 Malignant neoplasm of upper-inner quadrant of right female breast: Secondary | ICD-10-CM | POA: Diagnosis not present

## 2023-09-20 DIAGNOSIS — I2510 Atherosclerotic heart disease of native coronary artery without angina pectoris: Secondary | ICD-10-CM | POA: Diagnosis not present

## 2023-09-20 DIAGNOSIS — Z1721 Progesterone receptor positive status: Secondary | ICD-10-CM | POA: Insufficient documentation

## 2023-09-20 DIAGNOSIS — N6315 Unspecified lump in the right breast, overlapping quadrants: Secondary | ICD-10-CM | POA: Diagnosis not present

## 2023-09-20 DIAGNOSIS — Z801 Family history of malignant neoplasm of trachea, bronchus and lung: Secondary | ICD-10-CM | POA: Insufficient documentation

## 2023-09-20 DIAGNOSIS — C50111 Malignant neoplasm of central portion of right female breast: Secondary | ICD-10-CM | POA: Diagnosis present

## 2023-09-21 ENCOUNTER — Ambulatory Visit: Payer: Medicare Other | Admitting: Radiation Oncology

## 2023-09-21 ENCOUNTER — Inpatient Hospital Stay: Payer: Medicare Other | Admitting: Oncology

## 2023-09-21 NOTE — Progress Notes (Signed)
Hematology/Oncology Consult note [facility]  [phone] [phone]  Patient Care Team: Enid Baas, MD as PCP - General (Internal Medicine)   Name of the patient: Sylvia Rivera  [id number]  1946-07-25   Date of visit: 09/21/23  Diagnosis-stage I right breast cancer  Chief complaint/ Reason for visit- routine follow-up of right breast cancer  Heme/Onc history: Patient is a 77 year old female who was seen by me last in September 2022 when she was found to have right breast cancer but she chose not to go with any surgery or endocrine therapy.Bilateral mammogram on 09/06/2020 showed a 1.9 cm spiculated mass in the right breast and 2.4 cm mass in the left breast the right breast mass was a BI-RADS Category 5 and the left breast mass was a BI-RADS Category 4 both these breast masses were biopsied.  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  Tumor was ER 100% positive PR 5% positive and HER-2 equivocal by IHC but negative by FISH.  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  Patient was seen by surgical oncology at [facility] but then decided to move her care to [facility] and has not sought any treatment for her cancer.  She is presently unsure if she would like to pursue any treatment for her breast cancer.     Repeat right breast mammogram in February 2022 which showed similar size of the right breast mass which measured about 1.5 x 1.4 x 1.8 cm similar to prior.  No evidence of right axillary adenopathy.  Stable size of the left breast mass 2.3 x 1.3 x 2.2 cm which was biopsy-proven to be benign.   She has had 2 subsequent mammograms in September 2023 and 2024.  Mammogram and ultrasound from September 2024 showed gradual increase in the size of the right breast mass now measuring 2.6 x 2.4 x 1.5 cm.  2 adjacent  satellite lesions measuring 6 and 7 mm.  No suspicious right axillary adenopathy.  Patient has met with Dr. Maia Plan and was given options of partial mastectomy versus complete mastectomy    Interval history-patient is here with her daughter and remains undecided as to how she wants to deal with her right breast cancer.  It has not been bothering her so far.  ECOG PS- 2 Pain scale- 0   Review of systems- Review of Systems  Constitutional:  Negative for chills, fever, malaise/fatigue and weight loss.  HENT:  Negative for congestion, ear discharge and nosebleeds.   Eyes:  Negative for blurred vision.  Respiratory:  Negative for cough, hemoptysis, sputum production, shortness of breath and wheezing.   Cardiovascular:  Negative for chest pain, palpitations, orthopnea and claudication.  Gastrointestinal:  Negative for abdominal pain, blood in stool, constipation, diarrhea, heartburn, melena, nausea and vomiting.  Genitourinary:  Negative for dysuria, flank pain, frequency, hematuria and urgency.  Musculoskeletal:  Negative for back pain, joint pain and myalgias.  Skin:  Negative for rash.  Neurological:  Negative for dizziness, tingling, focal weakness, seizures, weakness and headaches.  Endo/Heme/Allergies:  Does not bruise/bleed easily.  Psychiatric/Behavioral:  Negative for depression and suicidal ideas. The patient does not have insomnia.       No Known Allergies   Past Medical History:  Diagnosis Date   Anemia    Breast cancer (HCC) 09/2020   right breast   Cataract    Fibromyalgia  Rheumatoid arthritis [facility])      Past Surgical History:  Procedure Laterality Date   BREAST BIOPSY Bilateral 09/2020   right- invasive mammary carcinoma, lt.- usual ductal hyperplasia    Social History   Socioeconomic History   Marital status: Widowed    Spouse name: Not on file   Number of children: Not on file   Years of education: Not on file   Highest education level: Not on file   Occupational History   Not on file  Tobacco Use   Smoking status: Never   Smokeless tobacco: Never  Vaping Use   Vaping status: Never Used  Substance and Sexual Activity   Alcohol use: Never   Drug use: Never   Sexual activity: Not Currently  Other Topics Concern   Not on file  Social History Narrative   Not on file   Social Determinants of Health   Financial Resource Strain: Not on file  Food Insecurity: No Food Insecurity (08/11/2020)   Received from [facility], [facility]   Hunger Vital Sign    Worried About Running Out of Food in the Last Year: Never true    Ran Out of Food in the Last Year: Never true  Transportation Needs: Not on file  Physical Activity: Not on file  Stress: Not on file  Social Connections: Not on file  Intimate Partner Violence: Not on file    Family History  Problem Relation Age of Onset   Lung cancer Sister    Cirrhosis Brother    Diabetes Maternal Grandmother    Lung cancer Niece    Esophageal cancer Sister      Current Outpatient Medications:    acetaminophen (TYLENOL) 650 MG CR tablet, Take 650 mg by mouth daily., Disp: , Rfl:    aspirin EC 81 MG tablet, Take 81 mg by mouth daily. Swallow whole., Disp: , Rfl:    ferrous sulfate 325 (65 FE) MG tablet, Take 325 mg by mouth daily with breakfast., Disp: , Rfl:    vitamin B-12 (CYANOCOBALAMIN) 1000 MCG tablet, Take 1,000 mcg by mouth daily., Disp: , Rfl:    VITAMIN D PO, Take 1,000 mcg by mouth daily., Disp: , Rfl:   Physical exam:  Vitals:   09/20/23 1513  BP: (!) 157/78  Pulse: 80  Resp: 16  Temp: (!) 97.1 F (36.2 C)  TempSrc: Tympanic  SpO2: 99%  Weight: 200 lb (90.7 kg)  Height: 5\' 7"  (1.702 m)   Physical Exam Constitutional:      Comments: Ambulates with a [place].  Appears in no acute distress  Cardiovascular:     Rate and Rhythm: Normal rate and regular rhythm.     Heart sounds: Normal heart sounds.  Pulmonary:     Effort: Pulmonary effort is normal.   Skin:    General: Skin is warm and dry.  Neurological:     Mental Status: She is alert and oriented to person, place, and time.          No data to display             No data to display          No images are attached to the encounter.  MM 3D DIAGNOSTIC MAMMOGRAM BILATERAL BREAST  Result Date: 09/06/2023 CLINICAL DATA:  Patient is status post ultrasound guided biopsy which demonstrated invasive ductal carcinoma micro papillary features on the RIGHT in October 2021. Patient has deferred any treatment. EXAM: DIGITAL DIAGNOSTIC BILATERAL MAMMOGRAM WITH  TOMOSYNTHESIS AND CAD; ULTRASOUND RIGHT BREAST LIMITED TECHNIQUE: Bilateral digital diagnostic mammography and breast tomosynthesis was performed. The images were evaluated with computer-aided detection. ; Targeted ultrasound examination of the right breast was performed COMPARISON:  Previous exam(s). ACR Breast Density Category b: There are scattered areas of fibroglandular density. FINDINGS: Diagnostic images of the RIGHT breast demonstrate continued increase in size of a retroareolar mass with associated nipple retraction. Immediately posterior to the dominant mass there are 2 additional masses most consistent with satellite nodules. These are similar to slightly increased in size comparison most recent prior. No suspicious mass, distortion, or microcalcifications are identified to suggest presence of malignancy of the LEFT. Targeted ultrasound was performed of the RIGHT breast. At 12 o'clock in the retroareolar breast, there is revisualization of an irregular hypoechoic mass with irregular margins. It measures 26 x 24 x 15 mm, previously 21 x 13 by 21 mm on most recent examination. It measures 18 by 15 x 14 mm in February 2022. Adjacent at 12 o'clock 4 cm from the nipple there is revisualization of an adjacent likely satellite mass. It measures 6 x 3 x 4 mm, previously 6 x 4 x 6 mm. Additional adjacent likely satellite mass measures 7 x 3  by 4 mm, previously 7 x 3 x 6 mm. These are favored similar in comparison to most recent prior given differences in scan plane and technique. Targeted ultrasound was performed of the RIGHT axilla. No new suspicious axillary lymph nodes are noted. A benign-appearing lymph node at 9 o'clock 14 cm from the nipple corresponds to a low axillary lymph node noted on today's mammogram. IMPRESSION: 1. Continued increase in size of a 26 mm biopsy-proven RIGHT breast malignancy. There is revisualization of 2 adjacent masses which likely reflect satellite lesions. 2. No new suspicious RIGHT axillary adenopathy. 3. No mammographic evidence of malignancy in the LEFT breast. RECOMMENDATION: Recommend surgical/clinical management of biopsy proven RIGHT breast malignancy. Recommend exploration of alternative options such as cryoablation therapy given patient reluctance for surgery. Patient and daughter were strongly encouraged to follow-up with their treatment team to develop a comprehensive treatment plan. I have discussed the findings and recommendations with the patient and patient's daughter. If applicable, a reminder letter will be sent to the patient regarding the next appointment. BI-RADS CATEGORY  6: Known biopsy-proven malignancy. Electronically Signed   By: Meda Klinefelter M.D.   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 Patient has deferred any treatment. EXAM: DIGITAL DIAGNOSTIC BILATERAL MAMMOGRAM WITH TOMOSYNTHESIS AND CAD; ULTRASOUND RIGHT BREAST LIMITED TECHNIQUE: Bilateral digital diagnostic mammography and breast tomosynthesis was performed. The images were evaluated with computer-aided detection. ; Targeted ultrasound examination of the right breast was performed COMPARISON:  Previous  exam(s). ACR Breast Density Category b: There are scattered areas of fibroglandular density. FINDINGS: Diagnostic images of the RIGHT breast demonstrate continued increase in size of a retroareolar mass with associated nipple retraction. Immediately posterior to the dominant mass there are 2 additional masses most consistent with satellite nodules. These are similar to slightly increased in size comparison most recent prior. No suspicious mass, distortion, or microcalcifications are identified to suggest presence of malignancy of the LEFT. Targeted ultrasound was performed of the RIGHT breast. At 12 o'clock in the retroareolar breast, there is revisualization of an irregular hypoechoic mass with  irregular margins. It measures 26 x 24 x 15 mm, previously 21 x 13 by 21 mm on most recent examination. It measures 18 by 15 x 14 mm in February 2022. Adjacent at 12 o'clock 4 cm from the nipple there is revisualization of an adjacent likely satellite mass. It measures 6 x 3 x 4 mm, previously 6 x 4 x 6 mm. Additional adjacent likely satellite mass measures 7 x 3 by 4 mm, previously 7 x 3 x 6 mm. These are favored similar in comparison to most recent prior given differences in scan plane and technique. Targeted ultrasound was performed of the RIGHT axilla. No new suspicious axillary lymph nodes are noted. A benign-appearing lymph node at 9 o'clock 14 cm from the nipple corresponds to a low axillary lymph node noted on today's mammogram. IMPRESSION: 1. Continued increase in size of a 26 mm biopsy-proven RIGHT breast malignancy. There is revisualization of 2 adjacent masses which likely reflect satellite lesions. 2. No new suspicious RIGHT axillary adenopathy. 3. No mammographic evidence of malignancy in the LEFT breast. RECOMMENDATION: Recommend surgical/clinical management of biopsy proven RIGHT breast malignancy. Recommend exploration of alternative options such as cryoablation therapy given patient reluctance for surgery.  Patient and daughter were strongly encouraged to follow-up with their treatment team to develop a comprehensive treatment plan. I have discussed the findings and recommendations with the patient and patient's daughter. If applicable, a reminder letter will be sent to the patient regarding the next appointment. BI-RADS CATEGORY  6: Known biopsy-proven malignancy. Electronically Signed   By: Meda Klinefelter M.D.   On: 09/06/2023 11:07     Assessment and plan- Patient is a 77 y.o. female with history of clinical prognostic stage Ia invasive mammary carcinoma of the right breast cT2 N0 M0 ER 100% positive, PR 5% positive and HER2 negative here to discuss further management  Patient has had stage I right breast cancer over the last 2 years which has been slowly growing.  She also has 2 adjacent satellite lesions measuring 6 and 7 mm next to her primary breast mass.  I have again highly encouraged the patient that she should first go for upfront surgery and she needs to discuss with Dr. Maia Plan about pros and cons of recovery with partial mastectomy versus complete mastectomy.  If she has a complete mastectomy she will not require radiation treatment.  If she has a partial mastectomy ideally she needs adjuvant radiation therapy but given her hesitation about getting any treatment in general it would be up to her if she wants to meet with radiation oncology or not.  Even if she undergoes a lumpectomy or mastectomy ideally there would be role for endocrine therapy for 5 years but again it would be up to the patient if she wishes to proceed with this option are not.  Regardless of whether she goes for radiation or endocrine therapy it would be in her best interest to proceed with surgery to excise the primary breast cancer mass completely so that there is no further growth and spread as well as concern for wound dehiscence in her chest wall.  I have also reached out to Dr. Maia Plan and he would like to meet the  patient and her daughter again to discuss surgery.  Follow-up with me to be decided based on when patient goes for surgery   Visit Diagnosis 1. Malignant neoplasm of upper-inner quadrant of right breast in female, estrogen receptor positive (HCC)   2. Goals of care, counseling/discussion  Dr. Owens Shark, MD, MPH [facility] [phone] 09/21/2023 8:41 AM

## 2023-10-06 ENCOUNTER — Ambulatory Visit: Payer: Medicare Other | Admitting: Radiation Oncology

## 2023-10-13 ENCOUNTER — Institutional Professional Consult (permissible substitution): Payer: Medicare Other | Admitting: Radiation Oncology
